# Patient Record
Sex: Male | Born: 1950 | Race: Black or African American | Hispanic: No | Marital: Single | Smoking: Current every day smoker
Health system: Southern US, Community
[De-identification: ages and names within clinical notes are randomized; demographics above are authoritative.]

## PROBLEM LIST (undated history)

## (undated) DIAGNOSIS — I10 Essential (primary) hypertension: Secondary | ICD-10-CM

## (undated) DIAGNOSIS — K219 Gastro-esophageal reflux disease without esophagitis: Secondary | ICD-10-CM

## (undated) HISTORY — PX: TONSILLECTOMY: SUR1361

---

## 2003-10-25 ENCOUNTER — Emergency Department (HOSPITAL_COMMUNITY): Admission: EM | Admit: 2003-10-25 | Discharge: 2003-10-25 | Payer: Self-pay

## 2017-02-15 ENCOUNTER — Emergency Department (HOSPITAL_BASED_OUTPATIENT_CLINIC_OR_DEPARTMENT_OTHER): Payer: Medicare HMO

## 2017-02-15 ENCOUNTER — Emergency Department (HOSPITAL_BASED_OUTPATIENT_CLINIC_OR_DEPARTMENT_OTHER)
Admission: EM | Admit: 2017-02-15 | Discharge: 2017-02-16 | Disposition: A | Discharge status: Home / Self Care | Payer: Medicare HMO | Attending: Emergency Medicine | Admitting: Emergency Medicine

## 2017-02-15 ENCOUNTER — Encounter (HOSPITAL_BASED_OUTPATIENT_CLINIC_OR_DEPARTMENT_OTHER): Payer: Self-pay

## 2017-02-15 DIAGNOSIS — K5289 Other specified noninfective gastroenteritis and colitis: Secondary | ICD-10-CM | POA: Diagnosis not present

## 2017-02-15 DIAGNOSIS — I10 Essential (primary) hypertension: Secondary | ICD-10-CM | POA: Insufficient documentation

## 2017-02-15 DIAGNOSIS — Z791 Long term (current) use of non-steroidal anti-inflammatories (NSAID): Secondary | ICD-10-CM | POA: Insufficient documentation

## 2017-02-15 DIAGNOSIS — F17210 Nicotine dependence, cigarettes, uncomplicated: Secondary | ICD-10-CM | POA: Insufficient documentation

## 2017-02-15 DIAGNOSIS — R1084 Generalized abdominal pain: Secondary | ICD-10-CM

## 2017-02-15 DIAGNOSIS — Z79899 Other long term (current) drug therapy: Secondary | ICD-10-CM | POA: Diagnosis not present

## 2017-02-15 DIAGNOSIS — K529 Noninfective gastroenteritis and colitis, unspecified: Secondary | ICD-10-CM

## 2017-02-15 HISTORY — DX: Essential (primary) hypertension: I10

## 2017-02-15 HISTORY — DX: Gastro-esophageal reflux disease without esophagitis: K21.9

## 2017-02-15 LAB — CBC WITH DIFFERENTIAL/PLATELET
BASOS ABS: 0 10*3/uL (ref 0.0–0.1)
BASOS PCT: 0 %
EOS ABS: 0 10*3/uL (ref 0.0–0.7)
Eosinophils Relative: 0 %
HCT: 41.6 % (ref 39.0–52.0)
Hemoglobin: 14.6 g/dL (ref 13.0–17.0)
Lymphocytes Relative: 28 %
Lymphs Abs: 3.2 10*3/uL (ref 0.7–4.0)
MCH: 32.8 pg (ref 26.0–34.0)
MCHC: 35.1 g/dL (ref 30.0–36.0)
MCV: 93.5 fL (ref 78.0–100.0)
MONO ABS: 0.7 10*3/uL (ref 0.1–1.0)
MONOS PCT: 6 %
NEUTROS PCT: 66 %
Neutro Abs: 7.7 10*3/uL (ref 1.7–7.7)
Platelets: 285 10*3/uL (ref 150–400)
RBC: 4.45 MIL/uL (ref 4.22–5.81)
RDW: 14.6 % (ref 11.5–15.5)
WBC: 11.7 10*3/uL — ABNORMAL HIGH (ref 4.0–10.5)

## 2017-02-15 LAB — COMPREHENSIVE METABOLIC PANEL
ALBUMIN: 3.7 g/dL (ref 3.5–5.0)
ALK PHOS: 46 U/L (ref 38–126)
ALT: 16 U/L — ABNORMAL LOW (ref 17–63)
ANION GAP: 6 (ref 5–15)
AST: 21 U/L (ref 15–41)
BILIRUBIN TOTAL: 0.6 mg/dL (ref 0.3–1.2)
BUN: 23 mg/dL — AB (ref 6–20)
CALCIUM: 9.9 mg/dL (ref 8.9–10.3)
CO2: 27 mmol/L (ref 22–32)
Chloride: 104 mmol/L (ref 101–111)
Creatinine, Ser: 1.57 mg/dL — ABNORMAL HIGH (ref 0.61–1.24)
GFR calc Af Amer: 52 mL/min — ABNORMAL LOW (ref >60)
GFR, EST NON AFRICAN AMERICAN: 45 mL/min — AB (ref >60)
GLUCOSE: 99 mg/dL (ref 65–99)
POTASSIUM: 4.7 mmol/L (ref 3.5–5.1)
Sodium: 137 mmol/L (ref 135–145)
TOTAL PROTEIN: 6.6 g/dL (ref 6.5–8.1)

## 2017-02-15 LAB — LIPASE, BLOOD: LIPASE: 21 U/L (ref 11–51)

## 2017-02-15 MED ORDER — IOPAMIDOL (ISOVUE-300) INJECTION 61%
100.0000 mL | Freq: Once | INTRAVENOUS | Status: AC | PRN
  Administered 2017-02-15: 100 mL via INTRAVENOUS

## 2017-02-15 MED ORDER — SODIUM CHLORIDE 0.9 % IV BOLUS (SEPSIS)
1000.0000 mL | Freq: Once | INTRAVENOUS | Status: AC
  Administered 2017-02-15: 1000 mL via INTRAVENOUS

## 2017-02-15 NOTE — ED Notes (Signed)
Pt reports eating a heavy meal last night and feeling hot throughout the night. Pt reports having abdominal pain, diarrhea and nausea since this AM. Pt took omeprazole with no relief.

## 2017-02-15 NOTE — ED Provider Notes (Signed)
[facility] Provider Note   CSN: [id number] Arrival date & time: 02/15/17  2031  By signing my name below, I, Sonum Patel, attest that this documentation has been prepared under the direction and in the presence of Johnnye Sandford, PA-C.Marland Kitchen Electronically Signed: Leone Payor, Scribe. 02/15/17. 9:43 PM.  History   Chief Complaint Chief Complaint  Patient presents with  . Abdominal Pain    The history is provided by the patient. No language interpreter was used.     HPI Comments: Gregg Kidd is a 66 y.o. male who presents to the Emergency Department complaining of constant, unchanged periumbilical abdominal pain that began this morning. He notes associated loose stools and lightheadedness. He denies nausea, vomiting, constipation, fever, dysuria, bloody or mucousy stools. He denies taking any OTC medications for his symptoms. He denies history of abdominal surgeries. He is a 0.5 ppd smoker. He drinks alcohol occasionally and denies illicit drug use.  He denies chest pain, shortness of breath, syncope, hematemesis.  Past Medical History:  Diagnosis Date  . GERD (gastroesophageal reflux disease)   . Hypertension     There are no active problems to display for this patient.   Past Surgical History:  Procedure Laterality Date  . TONSILLECTOMY         Home Medications    Prior to Admission medications   Medication Sig Start Date End Date Taking? Authorizing Provider  hydrochlorothiazide (MICROZIDE) 12.5 MG capsule Take 12.5 mg by mouth daily.   Yes [provider]  ibuprofen (ADVIL,MOTRIN) 600 MG tablet Take 600 mg by mouth 2 (two) times daily.   Yes [provider]  omeprazole (PRILOSEC) 20 MG capsule Take 20 mg by mouth daily.   Yes [provider]  ciprofloxacin (CIPRO) 500 MG tablet Take 1 tablet (500 mg total) by mouth 2 (two) times daily. 02/16/17 02/26/17  Kiyra Slaubaugh, PA-C  metroNIDAZOLE (FLAGYL) 500 MG tablet Take 1 tablet (500 mg  total) by mouth 3 (three) times daily. 02/16/17 02/26/17  Dietrich Pates, PA-C    Family History No family history on file.  Social History Social History  Substance Use Topics  . Smoking status: Current Every Day Smoker  . Smokeless tobacco: Never Used  . Alcohol use Yes     Comment: occ     Allergies   Patient has no known allergies.   Review of Systems Review of Systems  Constitutional: Negative for fever.  Gastrointestinal: Positive for abdominal pain and diarrhea. Negative for blood in stool, constipation, nausea and vomiting.  Genitourinary: Negative for dysuria.  Neurological: Positive for light-headedness.  All other systems reviewed and are negative.    Physical Exam Updated Vital Signs BP 131/89   Pulse (!) 53   Temp 98.5 F (36.9 C) (Oral)   Resp 18   Ht 6\' 4"  (1.93 m)   Wt 74.4 kg (164 lb)   SpO2 100%   BMI 19.96 kg/m   Physical Exam  Constitutional: He appears well-developed and well-nourished. No distress.  HENT:  Head: Normocephalic and atraumatic.  Nose: Nose normal.  Eyes: Conjunctivae and EOM are normal. Left eye exhibits no discharge. No scleral icterus.  Neck: Normal range of motion. Neck supple.  Cardiovascular: Normal rate, regular rhythm, normal heart sounds and intact distal pulses.  Exam reveals no gallop and no friction rub.   No murmur heard. Pulmonary/Chest: Effort normal and breath sounds normal. No respiratory distress.  Abdominal: Soft. Bowel sounds are normal. He exhibits no distension. There is tenderness.  There is no guarding.  Periumbilical tenderness.   Musculoskeletal: Normal range of motion. He exhibits no edema.  Neurological: He is alert. He exhibits normal muscle tone. Coordination normal.  Skin: Skin is warm and dry. No rash noted.  Psychiatric: He has a normal mood and affect.  Nursing note and vitals reviewed.    ED Treatments / Results  DIAGNOSTIC STUDIES: Oxygen Saturation is 100% on RA, normal by my  interpretation.    COORDINATION OF CARE: 9:43 PM Discussed treatment plan with pt at bedside and pt agreed to plan.   Labs (all labs ordered are listed, but only abnormal results are displayed) Labs Reviewed  COMPREHENSIVE METABOLIC PANEL - Abnormal; Notable for the following:       Result Value   BUN 23 (*)    Creatinine, Ser 1.57 (*)    ALT 16 (*)    GFR calc non Af Amer 45 (*)    GFR calc Af Amer 52 (*)    All other components within normal limits  CBC WITH DIFFERENTIAL/PLATELET - Abnormal; Notable for the following:    WBC 11.7 (*)    All other components within normal limits  LIPASE, BLOOD    EKG  EKG Interpretation None       Radiology Ct Abdomen Pelvis W Contrast  Result Date: 02/16/2017 CLINICAL DATA:  Periumbilical pain with nausea and diarrhea, onset today. EXAM: CT ABDOMEN AND PELVIS WITH CONTRAST TECHNIQUE: Multidetector CT imaging of the abdomen and pelvis was performed using the standard protocol following bolus administration of intravenous contrast. CONTRAST:  ISOVUE-300 IOPAMIDOL (ISOVUE-300) INJECTION 61% COMPARISON:  None. FINDINGS: Lower chest: Minimal emphysema of the left lung base. No acute abnormality. Hepatobiliary: Subcentimeter subcapsular hypodensity of the left hepatic lobe is too small to accurately characterize. Gallbladder physiologically distended, no calcified stone. No biliary dilatation. Pancreas: No ductal dilatation or inflammation. Spleen: Normal in size without focal abnormality. Adrenals/Urinary Tract: Normal adrenal glands. No hydronephrosis or perinephric edema. Symmetric excretion on delayed phase imaging. Urinary bladder is physiologically distended without wall thickening. Stomach/Bowel: Stomach is physiologically distended. No bowel obstruction. Possible wall thickening of the terminal ileum. There is mild wall thickening of the cecum and ascending colon. Small volume of stool in the transverse, descending, and sigmoid colon without  additional wall thickening. Normal appendix. Vascular/Lymphatic: Aortic atherosclerosis without aneurysm. No adenopathy. Reproductive: Prostate gland is enlarged spanning 7 cm transverse with nodular mass effect on the bladder base. Other: No free air, free fluid, or intra-abdominal fluid collection. Musculoskeletal: There are no acute or suspicious osseous abnormalities. IMPRESSION: 1. Wall thickening involving the terminal ileum, cecum and ascending colon suspicious for colitis/enteritis. This may be infectious or inflammatory, Crohn's disease is considered. 2. Enlarged prostate gland causing nodular mass effect on the bladder base. Recommend correlation with physical exam and PSA. 3. Aortic atherosclerosis. Electronically Signed   By: Rubye Oaks M.D.   On: 02/16/2017 00:00    Procedures Procedures (including critical care time)  Medications Ordered in ED Medications  acetaminophen (TYLENOL) 325 MG tablet (not administered)  sodium chloride 0.9 % bolus 1,000 mL (0 mLs Intravenous Stopped 02/15/17 2305)  iopamidol (ISOVUE-300) 61 % injection 100 mL (100 mLs Intravenous Contrast Given 02/15/17 2330)  acetaminophen (TYLENOL) tablet 650 mg (650 mg Oral Given 02/16/17 0007)     Initial Impression / Assessment and Plan / ED Course  I have reviewed the triage vital signs and the nursing notes.  Pertinent labs & imaging results that were available during my  care of the patient were reviewed by me and considered in my medical decision making (see chart for details).     Patient presents to ED for complaints of abdominal pain after waking up this morning. He reports 3 episodes of diarrhea which were nonbloody. He denies taking any medication for pain PTA. Patient reports tobacco use and occasional alcohol use. He denies any previous abdominal surgeries. Patient is afebrile with no history of fever. On physical exam the patient is tender to palpation in the periumbilical region but there is no  rebound or guarding present. CMP revealed elevation in creatinine and BUN to 1.57 and 23. All other electrolytes normal. CBC revealed mild leukocytosis at 11.7. Lipase unremarkable. CT of the abdomen and pelvis was obtained and showed possible colitis/enteritis. Also incidental finding of enlarged prostate. I made patient aware of this incidental finding. Will place on Cipro and Flagyl to cover for colitis. Patient states that he has a PCP appointment scheduled in 2 weeks and will make them aware of today's visit. Advised him to continue Tylenol or ibuprofen as needed for pain. Patient appears stable for discharge at this time. Strict return precautions given.  Final Clinical Impressions(s) / ED Diagnoses   Final diagnoses:  Generalized abdominal pain  Colitis    New Prescriptions New Prescriptions   CIPROFLOXACIN (CIPRO) 500 MG TABLET    Take 1 tablet (500 mg total) by mouth 2 (two) times daily.   METRONIDAZOLE (FLAGYL) 500 MG TABLET    Take 1 tablet (500 mg total) by mouth 3 (three) times daily.   I personally performed the services described in this documentation, which was scribed in my presence. The recorded information has been reviewed and is accurate.    Dietrich PatesKhatri, Aysen Shieh, PA-C 02/16/17 0016    Marily MemosMesner, Jason, MD 02/16/17 [phone]

## 2017-02-15 NOTE — ED Triage Notes (Signed)
C/o abd pain, diarrhea since 8am-NAD-steady gait

## 2017-02-16 MED ORDER — METRONIDAZOLE 500 MG PO TABS: 500.0000 mg | ORAL_TABLET | Freq: Three times a day (TID) | ORAL | 0 refills | Status: AC

## 2017-02-16 MED ORDER — ACETAMINOPHEN 325 MG PO TABS
ORAL_TABLET | ORAL | Status: AC
  Filled 2017-02-16: qty 1

## 2017-02-16 MED ORDER — CIPROFLOXACIN HCL 500 MG PO TABS: 500.0000 mg | ORAL_TABLET | Freq: Two times a day (BID) | ORAL | 0 refills | Status: AC

## 2017-02-16 MED ORDER — ACETAMINOPHEN 325 MG PO TABS
650.0000 mg | ORAL_TABLET | Freq: Once | ORAL | Status: AC
  Administered 2017-02-16: via ORAL
  Administered 2017-02-16: 650 mg via ORAL
  Filled 2017-02-16: qty 2

## 2017-02-16 NOTE — Discharge Instructions (Signed)
Take antibiotics as directed for 10 days. Take Tylenol or ibuprofen as needed for pain. Follow-up with PCP for further evaluation. Return to ED for worsening pain, lightheadedness, loss of consciousness, blood in stool, increased vomiting.

## 2017-02-17 NOTE — ED Notes (Addendum)
Pt called ED on 02/17/17 at 1017: States he is having an adverse reaction to one of his prescribed medications which he started taking yesterday, itching and blurred vision.  Advised patient to return to ED for evaluation by a medical provider.

## 2017-02-25 ENCOUNTER — Emergency Department (HOSPITAL_BASED_OUTPATIENT_CLINIC_OR_DEPARTMENT_OTHER)
Admission: EM | Admit: 2017-02-25 | Discharge: 2017-02-25 | Discharge status: Against Medical Advice | Payer: Medicare HMO

## 2018-09-20 IMAGING — CT CT ABD-PELV W/ CM
2 of 5 series · 16 of 46 positions shown, 18 images · IV contrast (APPLIED)
Comparison: None.

CLINICAL DATA: Periumbilical pain with nausea and diarrhea, onset
today.

EXAM:
CT ABDOMEN AND PELVIS WITH CONTRAST
TECHNIQUE: Multidetector CT imaging of the abdomen and pelvis was performed
using the standard protocol following bolus administration of
intravenous contrast.
CONTRAST:  100mL Q6CCST-OHH IOPAMIDOL (Q6CCST-OHH) INJECTION 61%

[Series 2: axial st · axial · 0.76mm/px · z∈[-480,-80]mm · 13 of 90 slices shown, 15 images]
[im 5/90  soft-tissue]
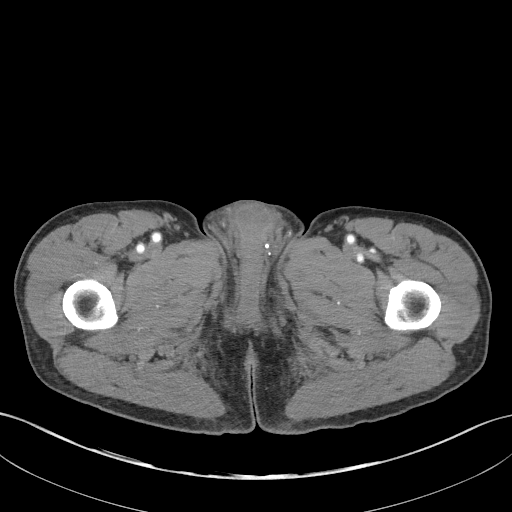
[im 5/90  bone]
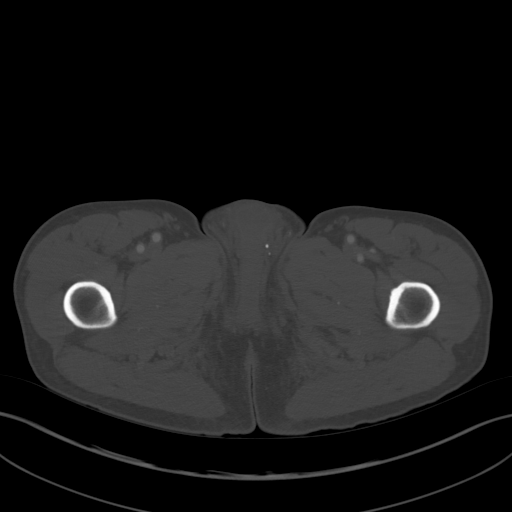
[im 15/90  soft-tissue]
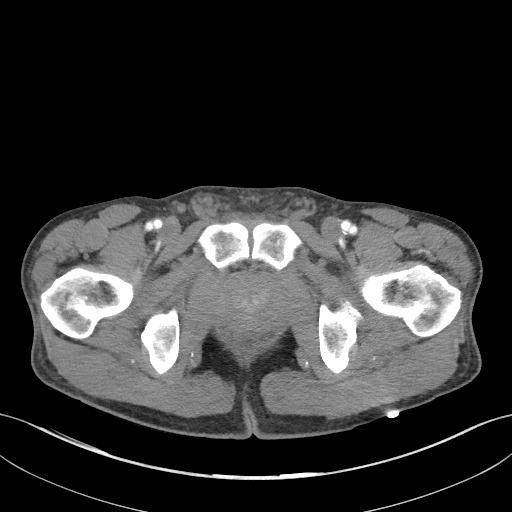
[im 19/90  soft-tissue]
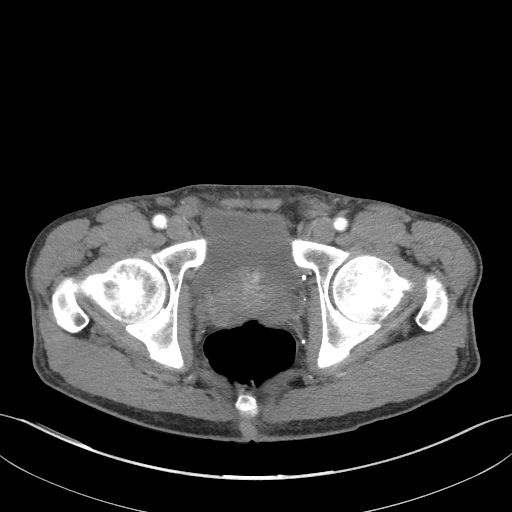
[im 24/90  soft-tissue]
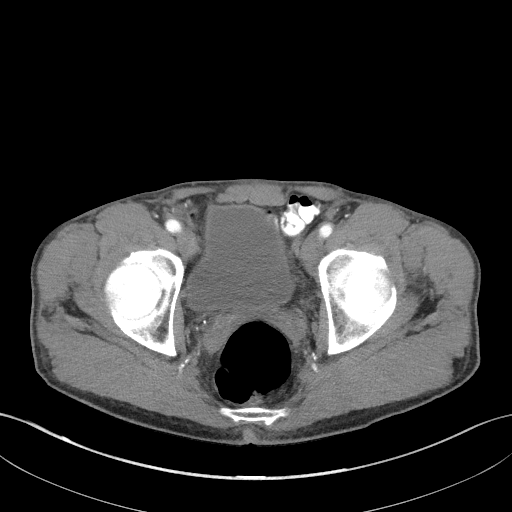
[im 33/90  soft-tissue]
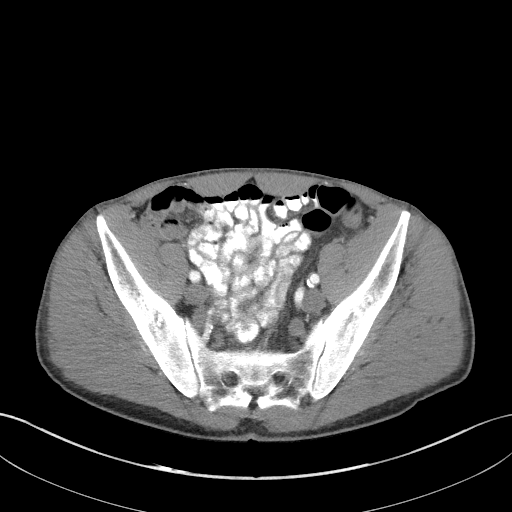
[im 38/90  soft-tissue]
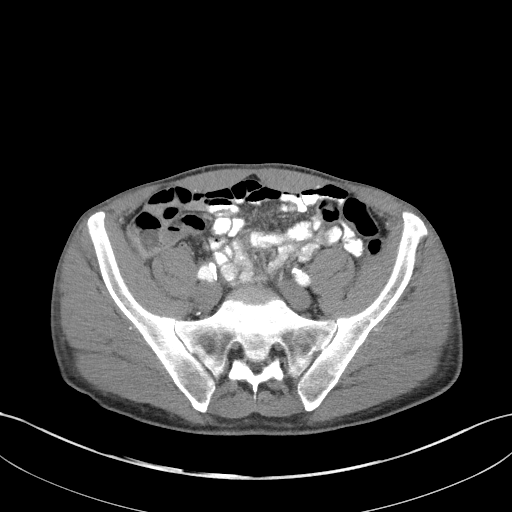
[im 47/90  soft-tissue]
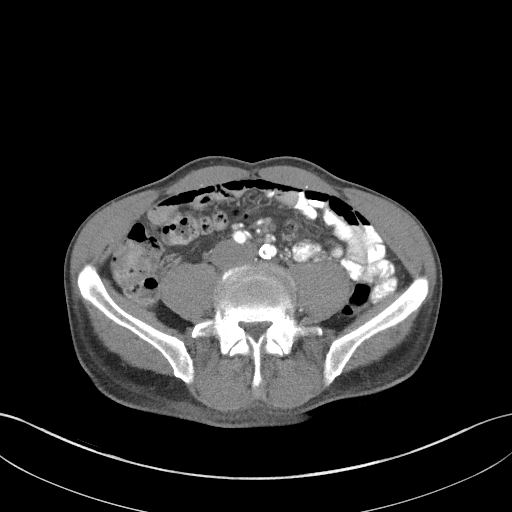
[im 52/90  soft-tissue]
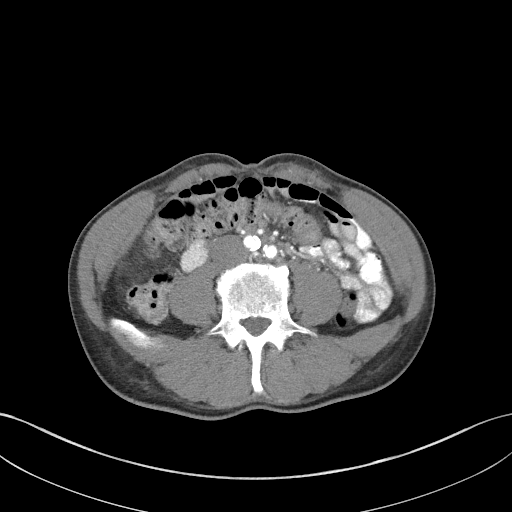
[im 57/90  soft-tissue]
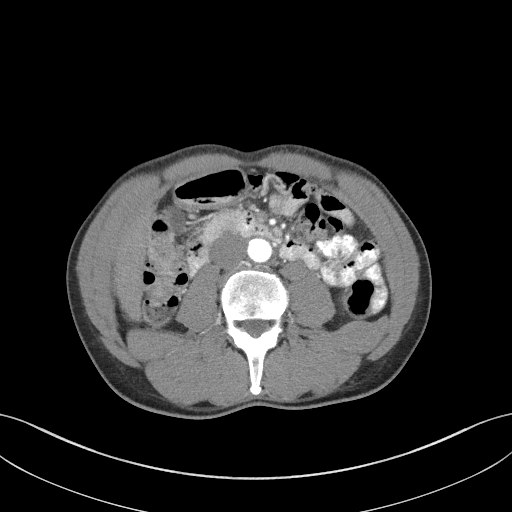
[im 57/90  bone]
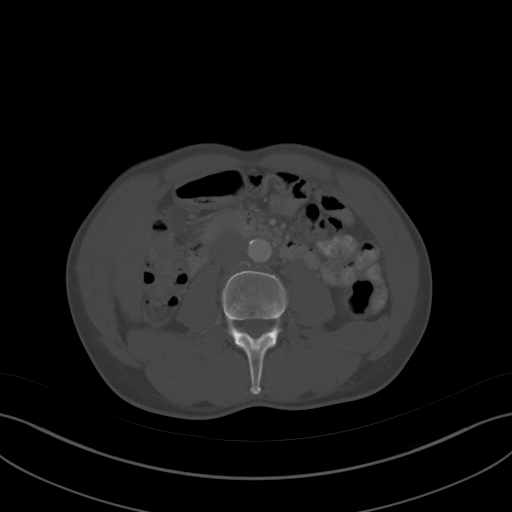
[im 66/90  soft-tissue]
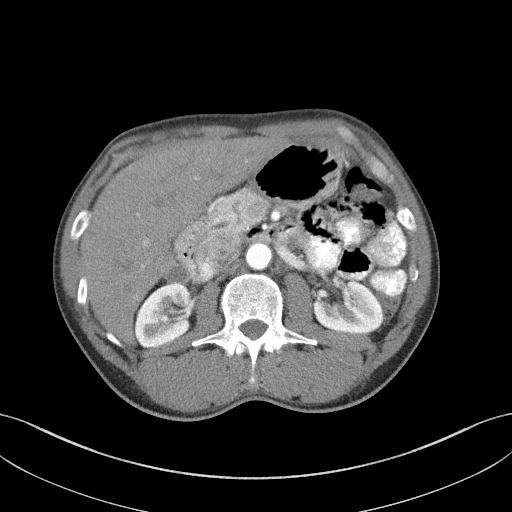
[im 71/90  soft-tissue]
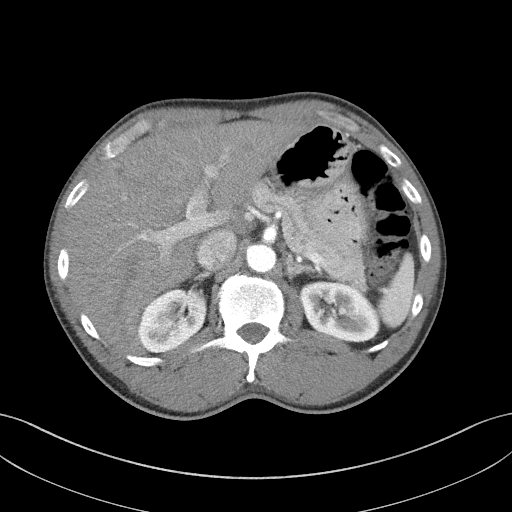
[im 75/90  soft-tissue]
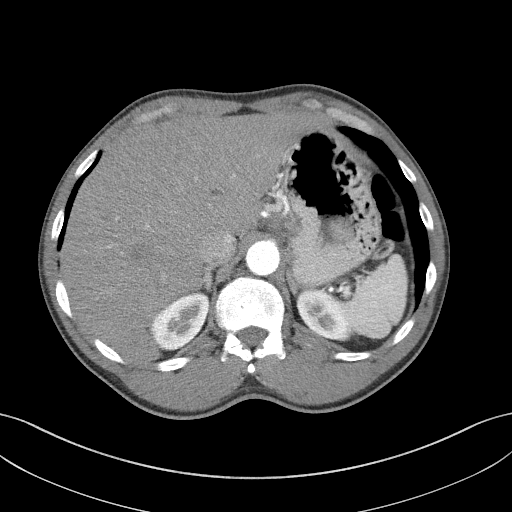
[im 85/90  soft-tissue]
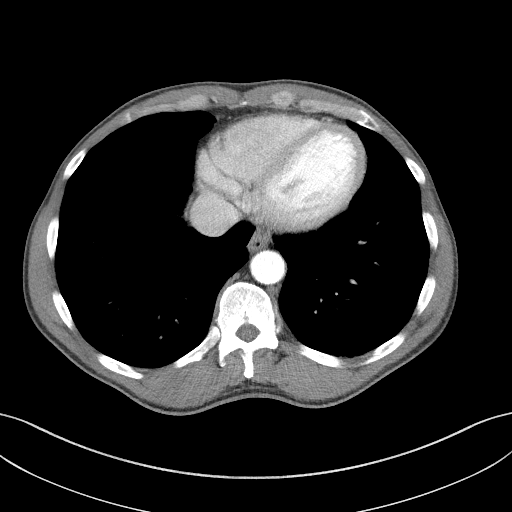

[Series 5: coronal st · coronal · 0.72mm/px · 3 of 96 slices shown]
[im 32/96  soft-tissue]
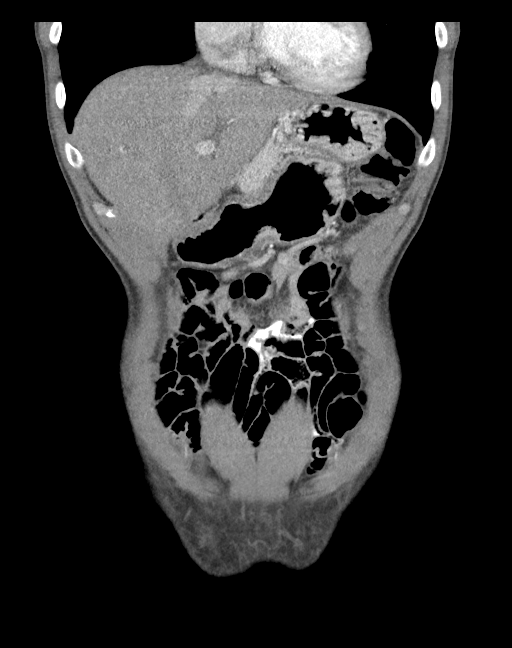
[im 43/96  soft-tissue]
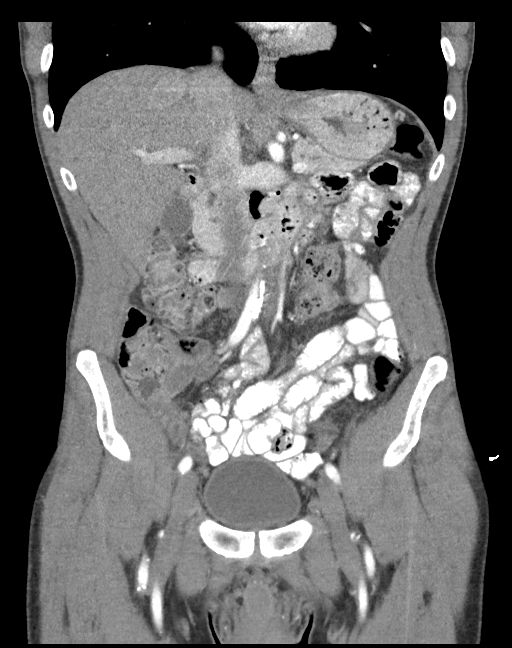
[im 53/96  soft-tissue]
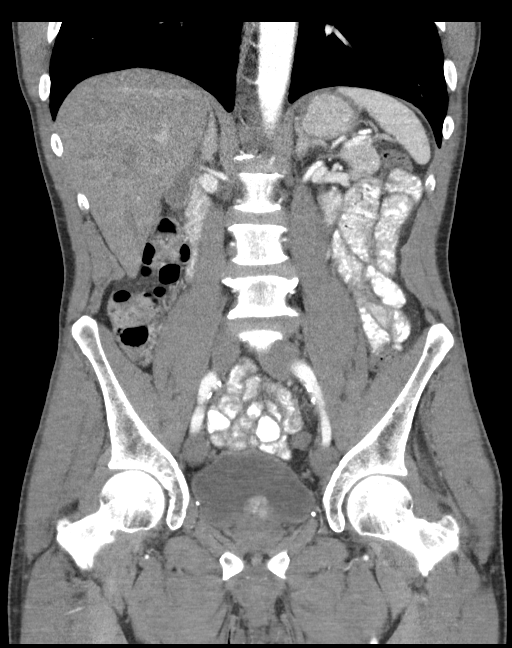

[16 of 46 positions shown; findings below may reference images not displayed]

FINDINGS: Lower chest: Minimal emphysema of the left lung base. No acute
abnormality.

Hepatobiliary: Subcentimeter subcapsular hypodensity of the left
hepatic lobe is too small to accurately characterize. Gallbladder
physiologically distended, no calcified stone. No biliary
dilatation.

Pancreas: No ductal dilatation or inflammation.

Spleen: Normal in size without focal abnormality.

Adrenals/Urinary Tract: Normal adrenal glands. No hydronephrosis or
perinephric edema. Symmetric excretion on delayed phase imaging.
Urinary bladder is physiologically distended without wall
thickening.

Stomach/Bowel: Stomach is physiologically distended. No bowel
obstruction. Possible wall thickening of the terminal ileum. There
is mild wall thickening of the cecum and ascending colon. Small
volume of stool in the transverse, descending, and sigmoid colon
without additional wall thickening. Normal appendix.

Vascular/Lymphatic: Aortic atherosclerosis without aneurysm. No
adenopathy.

Reproductive: Prostate gland is enlarged spanning 7 cm transverse
with nodular mass effect on the bladder base.

Other: No free air, free fluid, or intra-abdominal fluid collection.

Musculoskeletal: There are no acute or suspicious osseous
abnormalities.
IMPRESSION: 1. Wall thickening involving the terminal ileum, cecum and ascending
colon suspicious for colitis/enteritis. This may be infectious or
inflammatory, Crohn's disease is considered.
2. Enlarged prostate gland causing nodular mass effect on the
bladder base. Recommend correlation with physical exam and PSA.
3. Aortic atherosclerosis.
